# Patient Record
Sex: Female | Born: 1960 | Race: White | Hispanic: No | Marital: Married | Smoking: Never smoker
Health system: Southern US, Community
[De-identification: ages and names within clinical notes are randomized; demographics above are authoritative.]

## PROBLEM LIST (undated history)

## (undated) DIAGNOSIS — N200 Calculus of kidney: Secondary | ICD-10-CM

## (undated) DIAGNOSIS — K08409 Partial loss of teeth, unspecified cause, unspecified class: Secondary | ICD-10-CM

## (undated) HISTORY — PX: KNEE SURGERY: SHX244

## (undated) HISTORY — PX: BREAST BIOPSY: SHX20

## (undated) HISTORY — PX: LEG SURGERY: SHX1003

## (undated) HISTORY — PX: TONSILLECTOMY: SUR1361

## (undated) HISTORY — DX: Calculus of kidney: N20.0

## (undated) HISTORY — PX: NECK SURGERY: SHX720

---

## 2009-08-06 ENCOUNTER — Ambulatory Visit (HOSPITAL_COMMUNITY): Admission: RE | Admit: 2009-08-06 | Discharge: 2009-08-07 | Payer: Self-pay | Admitting: Neurosurgery

## 2010-09-23 LAB — CBC
HCT: 40.5 % (ref 36.0–46.0)
Hemoglobin: 13.9 g/dL (ref 12.0–15.0)
MCHC: 34.3 g/dL (ref 30.0–36.0)
MCV: 86.8 fL (ref 78.0–100.0)
Platelets: 240 10*3/uL (ref 150–400)
RBC: 4.67 MIL/uL (ref 3.87–5.11)
RDW: 13.8 % (ref 11.5–15.5)
WBC: 5.9 10*3/uL (ref 4.0–10.5)

## 2010-09-23 LAB — BASIC METABOLIC PANEL
BUN: 12 mg/dL (ref 6–23)
CO2: 27 mEq/L (ref 19–32)
Calcium: 9.1 mg/dL (ref 8.4–10.5)
Chloride: 104 mEq/L (ref 96–112)
Creatinine, Ser: 0.72 mg/dL (ref 0.4–1.2)
GFR calc Af Amer: >60 mL/min (ref >60)
GFR calc non Af Amer: >60 mL/min (ref >60)
Glucose, Bld: 105 mg/dL — ABNORMAL HIGH (ref 70–99)
Potassium: 4.1 mEq/L (ref 3.5–5.1)
Sodium: 138 mEq/L (ref 135–145)

## 2011-02-16 IMAGING — CR DG CHEST 1V PORT
1 series · 1 of 1 positions shown · non-contrast
Comparison: None.

CLINICAL DATA: Central venous catheter placement.  Cervical disc
herniation.

PORTABLE CHEST - 1 VIEW [DATE]/[id number] hours:

[view not recorded]
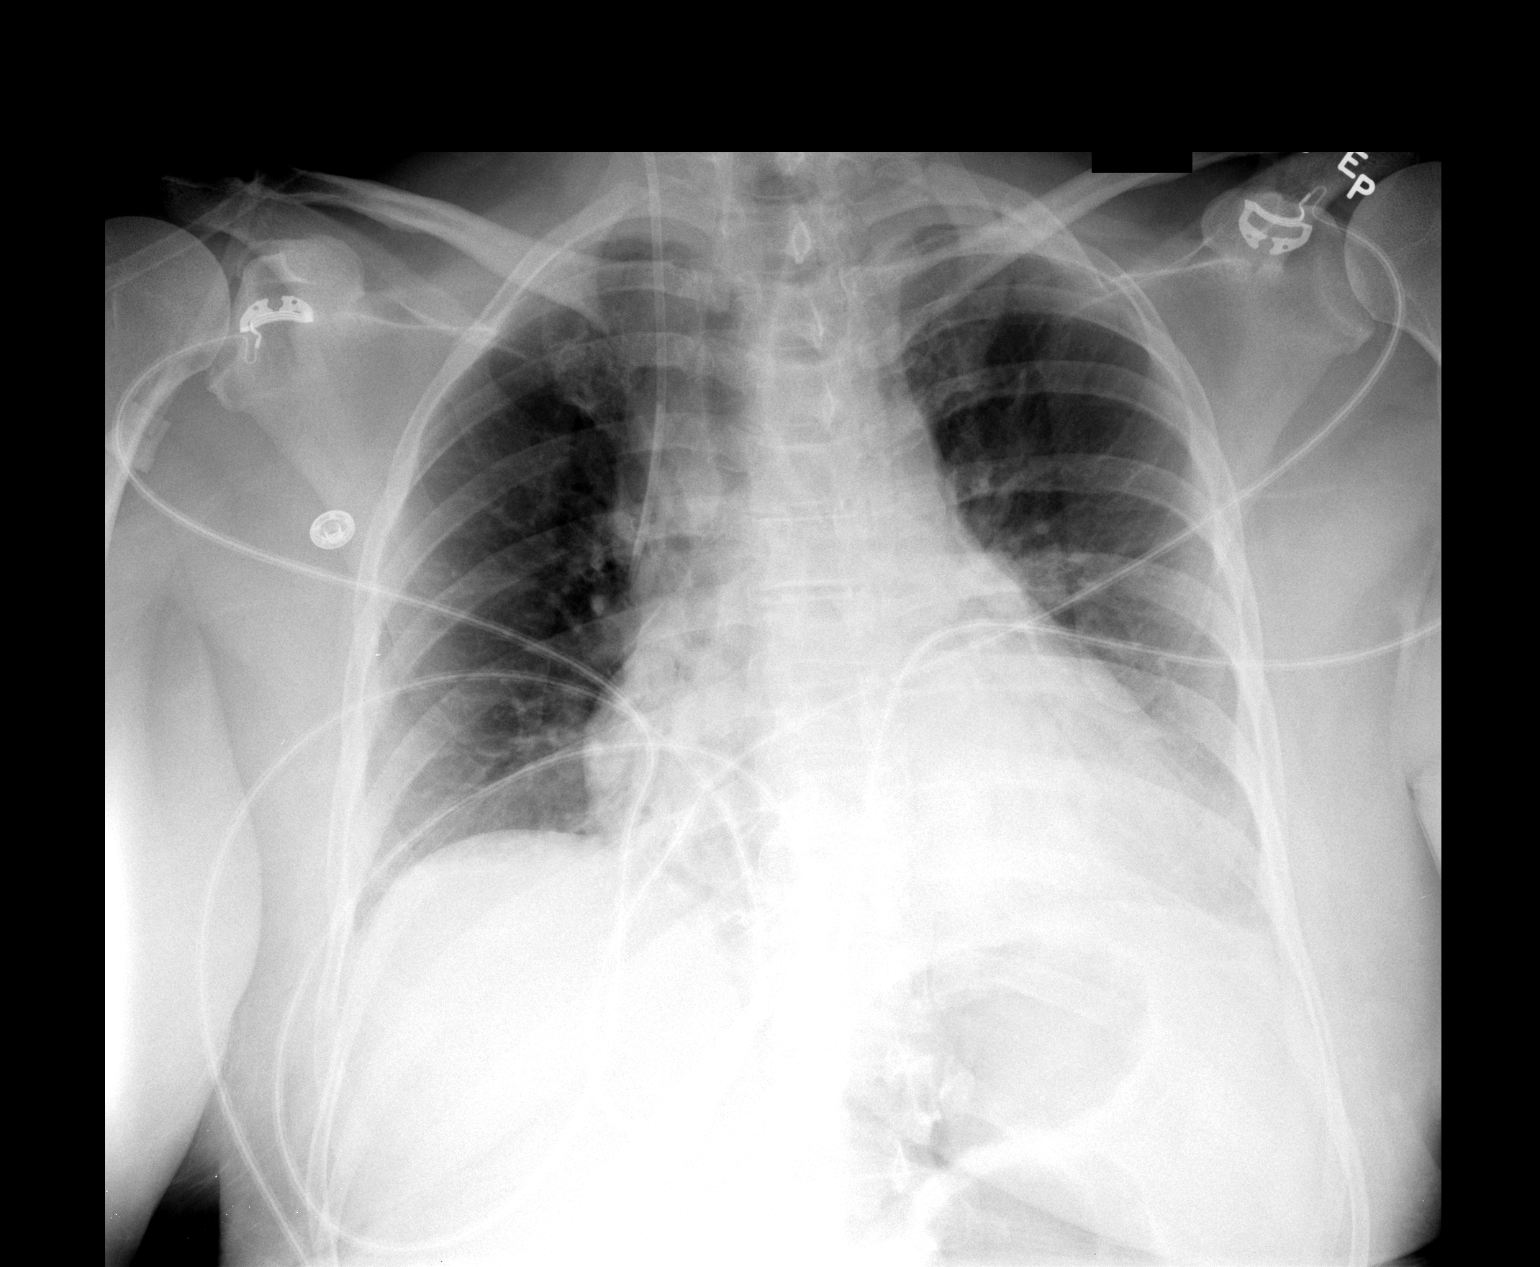

[1 of 1 positions shown; findings below may reference images not displayed]

FINDINGS: Right jugular central venous catheter tip in the mid SVC.
No evidence of pneumothorax or mediastinal hematoma.  Heart
enlarged.  Pulmonary vascularity normal.  Retrocardiac opacity with
possible air bronchograms.  Lungs otherwise clear.
IMPRESSION: 1.  Right jugular central venous catheter tip in the SVC.  No acute
complicating features.
2.  Probable atelectasis and/or pneumonia in the medial left lower
lobe.
3.  Cardiomegaly.  No evidence of pulmonary edema.

## 2019-07-02 ENCOUNTER — Encounter: Payer: Self-pay | Admitting: Gastroenterology

## 2019-08-06 ENCOUNTER — Encounter: Payer: Self-pay | Admitting: Gastroenterology

## 2019-08-06 ENCOUNTER — Ambulatory Visit: Payer: BC Managed Care – PPO | Admitting: Gastroenterology

## 2019-08-06 VITALS — BP 108/80 | HR 95 | Ht 65.0 in | Wt 206.0 lb

## 2019-08-06 DIAGNOSIS — Z800 Family history of malignant neoplasm of digestive organs: Secondary | ICD-10-CM

## 2019-08-06 DIAGNOSIS — Z01818 Encounter for other preprocedural examination: Secondary | ICD-10-CM

## 2019-08-06 MED ORDER — SUTAB 1479-225-188 MG PO TABS: 1.0000 | ORAL_TABLET | ORAL | 0 refills | Status: DC

## 2019-08-06 NOTE — Patient Instructions (Addendum)
You have been scheduled for a colonoscopy. Please follow written instructions given to you at your visit today.  Please pick up your prep supplies at the pharmacy within the next 1-3 days. If you use inhalers (even only as needed), please bring them with you on the day of your procedure.   

## 2019-08-06 NOTE — Progress Notes (Signed)
Referring Provider: No ref. provider found Primary Care Physician:  Romeo Rabon, MD  Reason for Consultation: Family history of colon cancer   IMPRESSION:  Prior colonoscopy with Dr. Aleene Davidson Family history of colon cancer (father in his 78s, maternal aunt in her 41s)  Colonoscopy recommended given the family history of colon cancer.  PLAN: Colonoscopy Obtain prior colonoscopy reports from Dr. Aleene Davidson  The nature of the procedure, as well as the risks, benefits, and alternatives were carefully and thoroughly reviewed with the patient. Ample time for discussion and questions allowed. The patient understood, was satisfied, and agreed to proceed.  Please see the "Patient Instructions" section for addition details about the plan.  HPI: Martha Murphy is a 59 y.o. 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 Self referred for a family history of colon cancer.  The history is obtained through the patient.  Prior colonoscopy with Dr. Aleene Davidson in [place] given her family history of colon cancer.  She has had a total of 3 colonoscopies. Total of 3 colonoscopies.  No history of polyps. No complications with the procedures.  Tolerated the bowel prep.  She reports no ongoing GI symptoms.  There is no dysphagia, odynophagia, regurgitation,  heartburn, nausea, abdominal pain, change in bowel habits, melena, hematochezia, or bright red blood per rectum. There is no anorexia or recent change in weight.   Father with colon cancer in his 1s.  Maternal aunt with colon cancer in her 2s. No other other known family history of colon cancer or polyps. No family history of uterine/endometrial cancer, pancreatic cancer or gastric/stomach cancer.   Past Medical History:  Diagnosis Date  . Kidney stones     Past Surgical History:  Procedure Laterality Date  . KNEE SURGERY    . LEG SURGERY     thigh  . NECK SURGERY      Current Outpatient Medications    Medication Sig Dispense Refill  . Cholecalciferol (D3 ADULT PO) Take 1 capsule by mouth daily.    . Ergocalciferol (VITAMIN D2 PO) Take 1 capsule by mouth daily.    . ERGOCALCIFEROL PO Take 1 capsule by mouth daily.    . Omega-3 Fatty Acids (FISH OIL) 1000 MG CAPS Take by mouth.     No current facility-administered medications for this visit.    Allergies as of 08/06/2019  . (No Known Allergies)    Family History  Problem Relation Age of Onset  . Heart disease Mother   . Breast cancer Mother   . Colon cancer Father 31  . Breast cancer Paternal Aunt     Social History   Socioeconomic History  . Marital status: Married    Spouse name: Not on file  . Number of children: Not on file  . Years of education: Not on file  . Highest education level: Not on file  Occupational History  . Not on file  Tobacco Use  . Smoking status: Never Smoker  Substance and Sexual Activity  . Alcohol use: Yes    Alcohol/week: 7.0 standard drinks    Types: 7 Glasses of wine per week    Comment: 1 glass of wine  . Drug use: Never  . Sexual activity: Not on file  Other Topics Concern  . Not on file  Social History Narrative  . Not on file   Social Determinants of Health   Financial Resource Strain:   . Difficulty of Paying Living Expenses: Not on file  Food Insecurity:   .  Worried About Charity fundraiser in the Last Year: Not on file  . Ran Out of Food in the Last Year: Not on file  Transportation Needs:   . Lack of Transportation (Medical): Not on file  . Lack of Transportation (Non-Medical): Not on file  Physical Activity:   . Days of Exercise per Week: Not on file  . Minutes of Exercise per Session: Not on file  Stress:   . Feeling of Stress : Not on file  Social Connections:   . Frequency of Communication with Friends and Family: Not on file  . Frequency of Social Gatherings with Friends and Family: Not on file  . Attends Religious Services: Not on file  . Active Member of  Clubs or Organizations: Not on file  . Attends Archivist Meetings: Not on file  . Marital Status: Not on file  Intimate Partner Violence:   . Fear of Current or Ex-Partner: Not on file  . Emotionally Abused: Not on file  . Physically Abused: Not on file  . Sexually Abused: Not on file    Review of Systems: 12 system ROS is negative except as noted above with some shortness of breath. Recent unintentional weight gain.    Physical Exam: General:   Alert,  well-nourished, pleasant and cooperative in NAD Head:  Normocephalic and atraumatic. Eyes:  Sclera clear, no icterus.   Conjunctiva pink. Ears:  Normal auditory acuity. Nose:  No deformity, discharge,  or lesions. Mouth:  No deformity or lesions.   Neck:  Supple; no masses or thyromegaly. Lungs:  Clear throughout to auscultation.   No wheezes. Heart:  Regular rate and rhythm; no murmurs. Abdomen:  Soft,nontender, nondistended, normal bowel sounds, no rebound or guarding. No hepatosplenomegaly.   Rectal:  Deferred  Msk:  Symmetrical. No boney deformities LAD: No inguinal or umbilical LAD Extremities:  No clubbing or edema. Neurologic:  Alert and  oriented x4;  grossly nonfocal Skin: No rash or bruise Psych:  Alert and cooperative. Normal mood and affect.     Nimrit Kehres L. Tarri Glenn, MD, MPH 08/06/2019, 3:42 PM

## 2019-08-12 ENCOUNTER — Telehealth: Payer: Self-pay | Admitting: Gastroenterology

## 2019-08-12 NOTE — Telephone Encounter (Signed)
Spoke to patient she is delighted that sutab code worked and it will only be $40. She will go to pick it up tomorrow and will call back if she has any questions.

## 2019-08-22 ENCOUNTER — Encounter: Payer: Self-pay | Admitting: Gastroenterology

## 2019-08-26 ENCOUNTER — Other Ambulatory Visit: Payer: Self-pay

## 2019-08-26 ENCOUNTER — Ambulatory Visit (INDEPENDENT_AMBULATORY_CARE_PROVIDER_SITE_OTHER): Payer: BC Managed Care – PPO

## 2019-08-26 ENCOUNTER — Other Ambulatory Visit: Payer: Self-pay | Admitting: Gastroenterology

## 2019-08-26 DIAGNOSIS — Z1159 Encounter for screening for other viral diseases: Secondary | ICD-10-CM

## 2019-08-27 LAB — SARS CORONAVIRUS 2 (TAT 6-24 HRS): SARS Coronavirus 2: NEGATIVE

## 2019-08-28 ENCOUNTER — Ambulatory Visit (AMBULATORY_SURGERY_CENTER): Payer: BC Managed Care – PPO | Admitting: Gastroenterology

## 2019-08-28 ENCOUNTER — Encounter: Payer: Self-pay | Admitting: Gastroenterology

## 2019-08-28 ENCOUNTER — Other Ambulatory Visit: Payer: Self-pay

## 2019-08-28 VITALS — BP 115/65 | HR 56 | Temp 97.3°F | Resp 12 | Ht 65.0 in | Wt 206.0 lb

## 2019-08-28 DIAGNOSIS — Z800 Family history of malignant neoplasm of digestive organs: Secondary | ICD-10-CM

## 2019-08-28 DIAGNOSIS — Z1211 Encounter for screening for malignant neoplasm of colon: Secondary | ICD-10-CM

## 2019-08-28 MED ORDER — SODIUM CHLORIDE 0.9 % IV SOLN: 500.0000 mL | Freq: Once | INTRAVENOUS | Status: DC

## 2019-08-28 NOTE — Progress Notes (Signed)
Report to PACU, RN, vss, BBS= Clear.  

## 2019-08-28 NOTE — Progress Notes (Signed)
Pt's states no medical or surgical changes since previsit or office visit.  Temp- June Vitals- Karina 

## 2019-08-28 NOTE — Op Note (Signed)
[facility] Patient Name: Martha Murphy Procedure Date: 08/28/2019 10:31 AM MRN: [id number] Endoscopist: Tressia Danas MD, MD Age: 59 Referring MD:  Date of Birth: [birth year] Gender: Female Account #: [id number] Procedure:                Colonoscopy Indications:              Screening patient at increased risk: Family history                            of 1st-degree relative with colorectal cancer at                            age 71 years (or older)                           Prior colonoscopy with Dr. Aleene Davidson                           Family history of colon cancer (father in his 37s,                            maternal aunt in her 68s) Medicines:                Monitored Anesthesia Care Procedure:                Pre-Anesthesia Assessment:                           - Prior to the procedure, a History and Physical                            was performed, and patient medications and                            allergies were reviewed. The patient's tolerance of                            previous anesthesia was also reviewed. The risks                            and benefits of the procedure and the sedation                            options and risks were discussed with the patient.                            All questions were answered, and informed consent                            was obtained. Prior Anticoagulants: The patient has                            taken no previous anticoagulant or antiplatelet  agents. ASA Grade Assessment: II - A patient with                            mild systemic disease. After reviewing the risks                            and benefits, the patient was deemed in                            satisfactory condition to undergo the procedure.                           After obtaining informed consent, the colonoscope                            was passed under direct vision. Throughout the        procedure, the patient's blood pressure, pulse, and                            oxygen saturations were monitored continuously. The                            Colonoscope was introduced through the anus and                            advanced to the the cecum, identified by                            appendiceal orifice and ileocecal valve. The                            colonoscopy was performed without difficulty. The                            patient tolerated the procedure well. The quality                            of the bowel preparation was good. The ileocecal                            valve, appendiceal orifice, and rectum were                            photographed. Scope In: 10:37:35 AM Scope Out: 10:51:54 AM Scope Withdrawal Time: 0 hours 11 minutes 27 seconds  Total Procedure Duration: 0 hours 14 minutes 19 seconds  Findings:                 A Skin tag was found on perianal exam. It was                            irritated and had recently been bleeding.  Non-bleeding internal hemorrhoids were found. The                            hemorrhoids were small.                           A few small-mouthed diverticula were found in the                            sigmoid colon.                           The exam was otherwise without abnormality on                            direct and retroflexion views. Complications:            No immediate complications. Estimated Blood Loss:     Estimated blood loss: none. Impression:               - The entire examined colon is normal.                           - The examination was otherwise normal on direct                            and retroflexion views.                           - No specimens collected. Recommendation:           - Patient has a contact number available for                            emergencies. The signs and symptoms of potential                            delayed complications were  discussed with the                            patient. Return to normal activities tomorrow.                            Written discharge instructions were provided to the                            patient.                           - Resume previous diet. High fiber diet encouraged.                           - Continue present medications.                           - Referral to Dr. Nadeen Landau re: skin tag.                           -  Repeat colonoscopy in 5 years for surveillance                            given the family history. Tressia Danas MD, MD 08/28/2019 11:01:25 AM This report has been signed electronically.

## 2019-08-28 NOTE — Patient Instructions (Signed)
A skin tag was found on perianal exam.  Irritated and had been bleeding. Non-bleeding internal hemorrhoids and few diverticula were found. Otherwise normal. Referral to [facility] for skin tag removal.  High fiber diet encouraged.     YOU HAD AN ENDOSCOPIC PROCEDURE TODAY AT THE [facility]:   Refer to the procedure report that was given to you for any specific questions about what was found during the examination.  If the procedure report does not answer your questions, please call your gastroenterologist to clarify.  If you requested that your care partner not be given the details of your procedure findings, then the procedure report has been included in a sealed envelope for you to review at your convenience later.  YOU SHOULD EXPECT: Some feelings of bloating in the abdomen. Passage of more gas than usual.  Walking can help get rid of the air that was put into your GI tract during the procedure and reduce the bloating. If you had a lower endoscopy (such as a colonoscopy or flexible sigmoidoscopy) you may notice spotting of blood in your stool or on the toilet paper. If you underwent a bowel prep for your procedure, you may not have a normal bowel movement for a few days.  Please Note:  You might notice some irritation and congestion in your nose or some drainage.  This is from the oxygen used during your procedure.  There is no need for concern and it should clear up in a day or so.  SYMPTOMS TO REPORT IMMEDIATELY:   Following lower endoscopy (colonoscopy or flexible sigmoidoscopy):  Excessive amounts of blood in the stool  Significant tenderness or worsening of abdominal pains  Swelling of the abdomen that is new, acute  Fever of 100F or higher  For urgent or emergent issues, a gastroenterologist can be reached at any hour by calling [phone].   DIET:  We do recommend a small meal at first, but then you may proceed to your regular diet.  Drink plenty of  fluids but you should avoid alcoholic beverages for 24 hours.  ACTIVITY:  You should plan to take it easy for the rest of today and you should NOT DRIVE or use heavy machinery until tomorrow (because of the sedation medicines used during the test).    FOLLOW UP: Our staff will call the number listed on your records 48-72 hours following your procedure to check on you and address any questions or concerns that you may have regarding the information given to you following your procedure. If we do not reach you, we will leave a message.  We will attempt to reach you two times.  During this call, we will ask if you have developed any symptoms of COVID 19. If you develop any symptoms (ie: fever, flu-like symptoms, shortness of breath, cough etc.) before then, please call [phone].  If you test positive for Covid 19 in the 2 weeks post procedure, please call and report this information to [place].    If any biopsies were taken you will be contacted by phone or by letter within the next 1-3 weeks.  Please call us at [phone] if you have not heard about the biopsies in 3 weeks.    SIGNATURES/CONFIDENTIALITY: You and/or your care partner have signed paperwork which will be entered into your electronic medical record.  These signatures attest to the fact that that the information above on your After Visit Summary has been reviewed and is understood.  Full responsibility  of the confidentiality of this discharge information lies with you and/or your care-partner.

## 2019-08-29 ENCOUNTER — Telehealth: Payer: Self-pay | Admitting: *Deleted

## 2019-08-29 NOTE — Telephone Encounter (Signed)
Referral for anal skin tag faxed to [facility] for Dr. Marin Olp.

## 2019-08-30 ENCOUNTER — Telehealth: Payer: Self-pay | Admitting: *Deleted

## 2019-08-30 NOTE — Telephone Encounter (Signed)
Patient is scheduled at [facility] with Dr. Cliffton Asters on 3/19 at 10:45 am.

## 2019-08-30 NOTE — Telephone Encounter (Signed)
  Follow up Call-  Call back number 08/28/2019  Post procedure Call Back phone  # [phone]  Permission to leave phone message Yes  Some recent data might be hidden     Patient questions:  Do you have a fever, pain , or abdominal swelling? No. Pain Score  0 *  Have you tolerated food without any problems? Yes.    Have you been able to return to your normal activities? Yes.    Do you have any questions about your discharge instructions: Diet   No. Medications  No. Follow up visit  No.  Do you have questions or concerns about your Care? No.  Actions: * If pain score is 4 or above: No action needed, pain <4.  1. Have you developed a fever since your procedure? no  2.   Have you had an respiratory symptoms (SOB or cough) since your procedure? no  3.   Have you tested positive for COVID 19 since your procedure no  4.   Have you had any family members/close contacts diagnosed with the COVID 19 since your procedure?  no   If yes to any of these questions please route to Laverna Peace, RN and Jennye Boroughs, Charity fundraiser.

## 2019-09-25 ENCOUNTER — Telehealth: Payer: Self-pay | Admitting: *Deleted

## 2019-09-25 NOTE — Telephone Encounter (Signed)
Followed up with [facility], the patient was scheduled with Dr. Cliffton Asters on 09/23/19.

## 2019-09-27 ENCOUNTER — Ambulatory Visit: Payer: Self-pay | Admitting: Surgery

## 2019-10-07 ENCOUNTER — Ambulatory Visit: Payer: Self-pay | Admitting: Surgery

## 2019-10-07 NOTE — H&P (Signed)
CC: Referred by Dr. Orvan Falconer for anal tag  HPI: Martha Murphy is a very pleasant 59yo female with family history of colon cancer who undergoes colonoscopies every 5 years. She recently underwent colonoscopy with Dr. beavers-08/28/19. She was found to have a skin tag on perianal exam is irritated and had recently bled. Nonbleeding internal hemorrhoids. Small amount diverticula in the sigmoid. Otherwise normal. She was subsequently referred to [place]. She reports that she is noticed having a tag in the perianal area for the last couple of years. She denies any history of anal pain or fissure like symptoms for as long as she can remember. She denies any history of rectal bleeding that she's been aware of. She reports some issues with regards to this tag including with hygiene.   PMH: Low vitamin D-on supplementation.  PSH: She denies any prior anorectal procedures or surgeries including hemorrhoid surgery.  FHx: Denies FHx of malignancy  Social: Denies use of tobacco/drugs; reports she drinks a glass of wine each day and be on the weekends-IPA. 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  ROS: A comprehensive 10 system review of systems was completed with the patient and pertinent findings as noted above.  The patient is a 59 year old female.   Past Surgical History (Chanel Lonni Fix, CMA; 09/23/2019 3:57 PM) Breast Mass; Local Excision  Bilateral. Cesarean Section - 1  Knee Surgery  Right.  Diagnostic Studies History (Chanel Lonni Fix, CMA; 09/23/2019 3:57 PM) Colonoscopy  within last year Mammogram  within last year Pap Smear  1-5 years ago  Allergies (Chanel Lonni Fix, CMA; 09/23/2019 3:58 PM) No Known Allergies [09/23/2019]: No Known Drug Allergies [09/23/2019]: Allergies Reconciled   Medication History (Chanel Lonni Fix, CMA; 09/23/2019 3:58 PM) No Current Medications Medications Reconciled  Social History Micheal Likens, CMA; 09/23/2019  3:57 PM) Alcohol use  Moderate alcohol use. Caffeine use  Coffee. No drug use  Tobacco use  Never smoker.  Family History (Chanel Lonni Fix, [place]; 09/23/2019 3:57 PM) Breast Cancer  Family Members In General, Mother. Colon Cancer  Family Members In General, Father. Colon Polyps  Family Members In General, Father. Heart Disease  Mother. Heart disease in female family member before age 13   Pregnancy / Birth History Micheal Likens, CMA; 09/23/2019 3:57 PM) Age at menarche  11 years. Age of menopause  3-50 Contraceptive History  Oral contraceptives. Gravida  1 Length (months) of breastfeeding  7-12 Maternal age  35-35 Para  1  Other Problems (Chanel Lonni Fix, CMA; 09/23/2019 3:57 PM) Breast Cancer     Review of Systems (Chanel Nolan CMA; 09/23/2019 3:57 PM) General Not Present- Appetite Loss, Chills, Fatigue, Fever, Night Sweats, Weight Gain and Weight Loss. Skin Not Present- Change in Wart/Mole, Dryness, Hives, Jaundice, New Lesions, Non-Healing Wounds, Rash and Ulcer. HEENT Present- Wears glasses/contact lenses. Not Present- Earache, Hearing Loss, Hoarseness, Nose Bleed, Oral Ulcers, Ringing in the Ears, Seasonal Allergies, Sinus Pain, Sore Throat, Visual Disturbances and Yellow Eyes. Respiratory Not Present- Bloody sputum, Chronic Cough, Difficulty Breathing, Snoring and Wheezing. Breast Not Present- Breast Mass, Breast Pain, Nipple Discharge and Skin Changes. Cardiovascular Not Present- Chest Pain, Difficulty Breathing Lying Down, Leg Cramps, Palpitations, Rapid Heart Rate, Shortness of Breath and Swelling of Extremities. Gastrointestinal Not Present- Abdominal Pain, Bloating, Bloody Stool, Change in Bowel Habits, Chronic diarrhea, Constipation, Difficulty Swallowing, Excessive gas, Gets full quickly at meals, Hemorrhoids, Indigestion, Nausea, Rectal Pain and Vomiting. Female Genitourinary Not Present- Frequency, Nocturia, Painful Urination, Pelvic Pain  and  Urgency. Musculoskeletal Not Present- Back Pain, Joint Pain, Joint Stiffness, Muscle Pain, Muscle Weakness and Swelling of Extremities. Neurological Not Present- Decreased Memory, Fainting, Headaches, Numbness, Seizures, Tingling, Tremor, Trouble walking and Weakness. Psychiatric Not Present- Anxiety, Bipolar, Change in Sleep Pattern, Depression, Fearful and Frequent crying. Endocrine Not Present- Cold Intolerance, Excessive Hunger, Hair Changes, Heat Intolerance, Hot flashes and New Diabetes. Hematology Not Present- Blood Thinners, Easy Bruising, Excessive bleeding, Gland problems, HIV and Persistent Infections.  Vitals (Chanel Nolan CMA; 09/23/2019 3:58 PM) 09/23/2019 3:58 PM Weight: 207.13 lb Height: 65in Body Surface Area: 2.01 m Body Mass Index: 34.47 kg/m  Temp.: 97.5F  Pulse: 91 (Regular)  BP: 118/72 (Sitting, Left Arm, Standard)       Physical Exam Harrell Gave M. Felicha Frayne MD; 09/23/2019 4:47 PM) The physical exam findings are as follows: Note:Constitutional: No acute distress; conversant; no deformities Eyes: Moist conjunctiva; no lid lag; anicteric sclerae; pupils equal and round Neck: Trachea midline; no palpable thyromegaly Lungs: Normal respiratory effort; no tactile fremitus CV: rrr; no palpable thrill; no pitting edema GI: Abdomen soft, nontender, nondistended; no palpable hepatosplenomegaly Anorectal: Prolapsed tag in the posterior midline cylindrical with similarly sized base. Soft, mobile, no ulcerations. No surrounding skin changes. DRE - normal tone and squeeze; no palpable masses Anoscopy: Circumferential anoscopy was limited due to poor tolerance. The posterior midline was able to be visualized and this appeared to originate from the distal anal canal. There is no apparent posterior midline anal fissure but this arises in this location and is suspicious for a hypertrophic anal papilla or epithelial tag. MSK: Normal gait; no  clubbing/cyanosis Psychiatric: Appropriate affect; alert and oriented 3 Lymphatic: No palpable cervical or axillary lymphadenopathy **A chaperone, [employer], was present for this encounter    Assessment & Plan Harrell Gave M. Razi Hickle MD; 09/23/2019 4:47 PM) ANAL SKIN TAG (K64.4) Story: Ms. Graeff is a very pleasant 43yoF with hypovitD here for evaluation of posterior midline anal tag Impression: -The anatomy and physiology of the anal canal was discussed at length with the patient. The pathophysiology of anal canal skin tags was discussed at length as well -We discussed options going forward including observation versus removal. This is causing her issues with hygiene and she would like to have this removed. We therefore discussed removal of anal skin lesion/tag, anorectal exam under anesthesia. -The planned procedure, material risks (including, but not limited to, pain, bleeding, infection, scarring, need for blood transfusion, damage to anal sphincter, incontinence of gas and/or stool, need for additional procedures, recurrence of the lesion, very rare cases of perianal sepsis and cases where ostomy or severe infection may occur after anorectal surgery, pneumonia, heart attack, stroke, death) benefits and alternatives to surgery were discussed at length. I noted a good probability that the procedure would help improve her symptoms in regards to anatomic prolapse of the tag. The patient's questions were answered to her satisfaction, she voiced understanding and elected to proceed with surgery. Additionally, we discussed typical postoperative expectations and the recovery process.  This patient encounter took 36 minutes today to perform the following: take history, perform exam, review outside records, interpret imaging, counsel the patient on their diagnosis and document encounter, findings & plan in the EHR Current Plans ANOSCOPY, DIAGNOSTIC ([id number]) (Anoscopy: Circumferential anoscopy was  limited due to poor tolerance. The posterior midline was able to be visualized and this appeared to originate from the distal anal canal. There is no apparent posterior midline anal fissure but this) Signed by Ileana Roup, MD (  09/23/2019 4:47 PM)

## 2019-10-25 ENCOUNTER — Encounter (HOSPITAL_BASED_OUTPATIENT_CLINIC_OR_DEPARTMENT_OTHER): Payer: Self-pay | Admitting: Surgery

## 2019-10-31 ENCOUNTER — Other Ambulatory Visit (HOSPITAL_COMMUNITY): Payer: BC Managed Care – PPO

## 2019-10-31 ENCOUNTER — Encounter (HOSPITAL_COMMUNITY): Payer: BC Managed Care – PPO

## 2019-11-04 ENCOUNTER — Encounter (HOSPITAL_BASED_OUTPATIENT_CLINIC_OR_DEPARTMENT_OTHER): Admission: RE | Payer: Self-pay | Source: Home / Self Care

## 2019-11-04 ENCOUNTER — Ambulatory Visit (HOSPITAL_BASED_OUTPATIENT_CLINIC_OR_DEPARTMENT_OTHER): Admission: RE | Admit: 2019-11-04 | Payer: BC Managed Care – PPO | Source: Home / Self Care | Admitting: Surgery

## 2019-11-04 HISTORY — DX: Partial loss of teeth, unspecified cause, unspecified class: K08.409

## 2019-11-04 SURGERY — EXAM UNDER ANESTHESIA
Anesthesia: Choice

## 2024-06-20 ENCOUNTER — Other Ambulatory Visit: Payer: Self-pay | Admitting: Obstetrics and Gynecology

## 2024-06-20 DIAGNOSIS — Z1231 Encounter for screening mammogram for malignant neoplasm of breast: Secondary | ICD-10-CM

## 2024-06-21 ENCOUNTER — Other Ambulatory Visit: Payer: Self-pay | Admitting: Obstetrics and Gynecology

## 2024-06-21 DIAGNOSIS — Z1239 Encounter for other screening for malignant neoplasm of breast: Secondary | ICD-10-CM
